# Patient Record
Sex: Male | Born: 1990 | Race: White | Hispanic: No | Marital: Single | Smoking: Current every day smoker
Health system: Southern US, Community
[De-identification: ages and names within clinical notes are randomized; demographics above are authoritative.]

---
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
## 2015-09-07 ENCOUNTER — Encounter (HOSPITAL_COMMUNITY): Payer: Self-pay | Admitting: *Deleted

## 2015-09-07 ENCOUNTER — Emergency Department (HOSPITAL_COMMUNITY)
Admission: EM | Admit: 2015-09-07 | Discharge: 2015-09-07 | Disposition: A | Discharge status: Home / Self Care | Payer: PRIVATE HEALTH INSURANCE | Attending: Emergency Medicine | Admitting: Emergency Medicine

## 2015-09-07 DIAGNOSIS — F17210 Nicotine dependence, cigarettes, uncomplicated: Secondary | ICD-10-CM | POA: Diagnosis not present

## 2015-09-07 DIAGNOSIS — J020 Streptococcal pharyngitis: Secondary | ICD-10-CM | POA: Diagnosis not present

## 2015-09-07 DIAGNOSIS — R220 Localized swelling, mass and lump, head: Secondary | ICD-10-CM | POA: Diagnosis present

## 2015-09-07 LAB — RAPID STREP SCREEN (MED CTR MEBANE ONLY): STREPTOCOCCUS, GROUP A SCREEN (DIRECT): POSITIVE — AB

## 2015-09-07 MED ORDER — IBUPROFEN 800 MG PO TABS: 800.0000 mg | ORAL_TABLET | Freq: Three times a day (TID) | ORAL | Status: AC

## 2015-09-07 MED ORDER — PENICILLIN G BENZATHINE 1200000 UNIT/2ML IM SUSP
1.2000 10*6.[IU] | Freq: Once | INTRAMUSCULAR | Status: AC
  Administered 2015-09-07: 1.2 10*6.[IU] via INTRAMUSCULAR
  Filled 2015-09-07: qty 2

## 2015-09-07 MED ORDER — IBUPROFEN 800 MG PO TABS
800.0000 mg | ORAL_TABLET | Freq: Once | ORAL | Status: DC
  Filled 2015-09-07: qty 1

## 2015-09-07 NOTE — Discharge Instructions (Signed)

## 2015-09-08 NOTE — ED Provider Notes (Signed)
CSN: [id number]     Arrival date & time 09/07/15  1826 History   First MD Initiated Contact with Patient 09/07/15 2006     Chief Complaint  Patient presents with  . Oral Swelling     (Consider location/radiation/quality/duration/timing/severity/associated sxs/prior Treatment) The history is provided by the patient.   Charlton AmorBrian C Arellano is a 25 y.o. male presenting with a 3 day history of sore throat, swollen tonsils with development of white spots in his throat since yesterday along with subjective fevers and chills.  He denies sob, chest pain, nasal congestion.  He endorses ear pain only when swallowing.  He was seen at an urgent care center 2 days ago with a negative strep test.  He feels worse since that evaluation. He has found no alleviators for his symptoms.     History reviewed. No pertinent past medical history. History reviewed. No pertinent past surgical history. No family history on file. Social History  Substance Use Topics  . Smoking status: Current Every Day Smoker    Types: Cigarettes  . Smokeless tobacco: None  . Alcohol Use: No    Review of Systems  Constitutional: Positive for fever and chills.  HENT: Positive for sore throat. Negative for congestion, ear pain, rhinorrhea, sinus pressure, trouble swallowing and voice change.   Eyes: Negative for discharge.  Respiratory: Negative for cough, shortness of breath, wheezing and stridor.   Cardiovascular: Negative for chest pain.  Gastrointestinal: Negative for nausea, vomiting and abdominal pain.  Genitourinary: Negative.   Skin: Negative.       Allergies  Ritalin  Home Medications   Prior to Admission medications   Medication Sig Start Date End Date Taking? Authorizing Provider  acetaminophen (TYLENOL) 325 MG tablet Take 650 mg by mouth every 6 (six) hours as needed for moderate pain.   Yes Historical Provider, MD  ibuprofen (ADVIL,MOTRIN) 800 MG tablet Take 1 tablet (800 mg total) by mouth 3 (three) times  daily. 09/07/15   Burgess AmorJulie Enio Hornback, PA-C   BP 138/84 mmHg  Pulse 92  Temp(Src) 98.3 F (36.8 C) (Oral)  Resp 18  SpO2 99% Physical Exam  Constitutional: He is oriented to person, place, and time. He appears well-developed and well-nourished.  HENT:  Head: Normocephalic and atraumatic.  Right Ear: Tympanic membrane and ear canal normal.  Left Ear: Tympanic membrane and ear canal normal.  Nose: Mucosal edema and rhinorrhea present.  Mouth/Throat: Uvula is midline and mucous membranes are normal. No trismus in the jaw. Oropharyngeal exudate and posterior oropharyngeal erythema present. No posterior oropharyngeal edema or tonsillar abscesses.  Bilateral hypertrophied tonsils, 2+. Uvula midline.  Eyes: Conjunctivae are normal.  Cardiovascular: Normal rate and normal heart sounds.   Pulmonary/Chest: Effort normal. No respiratory distress. He has no wheezes. He has no rales.  Abdominal: Soft. There is no tenderness.  Musculoskeletal: Normal range of motion.  Lymphadenopathy:       Head (right side): Tonsillar adenopathy present.       Head (left side): Tonsillar adenopathy present.  Neurological: He is alert and oriented to person, place, and time.  Skin: Skin is warm and dry. No rash noted.  Psychiatric: He has a normal mood and affect.    ED Course  Procedures (including critical care time) Labs Review Labs Reviewed  RAPID STREP SCREEN (NOT [facility]) - Abnormal; Notable for the following:    Streptococcus, Group A Screen (Direct) POSITIVE (*)    All other components within normal limits    Imaging Review No  results found. I have personally reviewed and evaluated these images and lab results as part of my medical decision-making.   EKG Interpretation None      MDM   Final diagnoses:  Strep pharyngitis    Pt preferred bicillin LA.  Advised motrin for fever and throat pain relief, increased fluids.  Recheck by pcp or return here for any worsened or persistent sx.  The patient  appears reasonably screened and/or stabilized for discharge and I doubt any other medical condition or other [facility] requiring further screening, evaluation, or treatment in the ED at this time prior to discharge.     [name], PA-C 09/08/15 1610  Nelva Nay, MD 09/08/15 [phone]
